# Patient Record
Sex: Male | Born: 1975 | Race: White | Hispanic: No | Marital: Married | Smoking: Former smoker
Health system: Southern US, Community
[De-identification: ages and names within clinical notes are randomized; demographics above are authoritative.]

## PROBLEM LIST (undated history)

## (undated) DIAGNOSIS — L509 Urticaria, unspecified: Secondary | ICD-10-CM

## (undated) HISTORY — DX: Urticaria, unspecified: L50.9

---

## 2016-05-26 ENCOUNTER — Encounter (HOSPITAL_COMMUNITY): Payer: Self-pay | Admitting: Emergency Medicine

## 2016-05-26 ENCOUNTER — Emergency Department (HOSPITAL_COMMUNITY)
Admission: EM | Admit: 2016-05-26 | Discharge: 2016-05-26 | Disposition: A | Discharge status: Home / Self Care | Payer: BLUE CROSS/BLUE SHIELD | Attending: Emergency Medicine | Admitting: Emergency Medicine

## 2016-05-26 DIAGNOSIS — R1084 Generalized abdominal pain: Secondary | ICD-10-CM | POA: Diagnosis present

## 2016-05-26 DIAGNOSIS — Z87891 Personal history of nicotine dependence: Secondary | ICD-10-CM | POA: Diagnosis not present

## 2016-05-26 DIAGNOSIS — R110 Nausea: Secondary | ICD-10-CM | POA: Insufficient documentation

## 2016-05-26 LAB — COMPREHENSIVE METABOLIC PANEL
ALBUMIN: 4.8 g/dL (ref 3.5–5.0)
ALK PHOS: 40 U/L (ref 38–126)
ALT: 15 U/L — ABNORMAL LOW (ref 17–63)
ANION GAP: 9 (ref 5–15)
AST: 18 U/L (ref 15–41)
BILIRUBIN TOTAL: 0.5 mg/dL (ref 0.3–1.2)
BUN: 12 mg/dL (ref 6–20)
CALCIUM: 8.8 mg/dL — AB (ref 8.9–10.3)
CO2: 25 mmol/L (ref 22–32)
Chloride: 102 mmol/L (ref 101–111)
Creatinine, Ser: 1.04 mg/dL (ref 0.61–1.24)
GFR calc non Af Amer: >60 mL/min (ref >60)
GLUCOSE: 111 mg/dL — AB (ref 65–99)
POTASSIUM: 3.9 mmol/L (ref 3.5–5.1)
SODIUM: 136 mmol/L (ref 135–145)
TOTAL PROTEIN: 7.3 g/dL (ref 6.5–8.1)

## 2016-05-26 LAB — CBC
HEMATOCRIT: 41.9 % (ref 39.0–52.0)
HEMOGLOBIN: 14 g/dL (ref 13.0–17.0)
MCH: 30.8 pg (ref 26.0–34.0)
MCHC: 33.4 g/dL (ref 30.0–36.0)
MCV: 92.1 fL (ref 78.0–100.0)
Platelets: 225 10*3/uL (ref 150–400)
RBC: 4.55 MIL/uL (ref 4.22–5.81)
RDW: 12.1 % (ref 11.5–15.5)
WBC: 6 10*3/uL (ref 4.0–10.5)

## 2016-05-26 LAB — URINALYSIS, ROUTINE W REFLEX MICROSCOPIC
Bilirubin Urine: NEGATIVE
Glucose, UA: NEGATIVE mg/dL
Hgb urine dipstick: NEGATIVE
Ketones, ur: NEGATIVE mg/dL
Leukocytes, UA: NEGATIVE
NITRITE: NEGATIVE
PH: 7 (ref 5.0–8.0)
Protein, ur: NEGATIVE mg/dL
SPECIFIC GRAVITY, URINE: 1.017 (ref 1.005–1.030)

## 2016-05-26 LAB — LIPASE, BLOOD: Lipase: 37 U/L (ref 11–51)

## 2016-05-26 NOTE — ED Triage Notes (Addendum)
Pt c/o severe generalized intermittent abdominal pain onset a few hours ago. No n/v/diarrhea. No sick contacts. No rebound tenderness.

## 2016-05-26 NOTE — ED Provider Notes (Signed)
[facility]-EMERGENCY DEPT Provider Note   CSN: [id number] Arrival date & time: 05/26/16  1431     History   Chief Complaint Chief Complaint  Patient presents with  . Abdominal Pain    HPI Nicholas Mosley is a 40 y.o. otherwise healthy male with no PMHx or PSHx who presents to the ED with complaints of sudden onset generalized abdominal pain that began just after lunch time around 1 PM (4.5hrs prior to exam). Patient states that he ate barbecue and a salad at [employer] in [place], went back to work and felt abdominal pain start so he left work to go home, but the pain continued to worsen so he came to the emergency room. He describes the pain as 10/10 intermittent "in waves" generalized sharp nonradiating abd pain, worse with movement, and with no specific treatments tried prior to arrival. Patient states that during the waves of pain he would have some nausea associated with it. He states that approximately 1 hour ago while waiting in the waiting room, the pain and nausea completely resolved and he has no residual symptoms. In fact, he states he was going to leave because the symptoms were gone, but "figured he'd waited 3.5hrs already so he might as well stay and get checked out".  He denies fevers, chills, CP, SOB, ongoing abd pain, ongoing nausea, vomiting, diarrhea, constipation, obstipation, melena, hematochezia, abd distension/bloating, hematuria, dysuria, flank pain, testicular pain/swelling, penile discharge, myalgias, arthralgias, numbness, tingling, weakness, recent travel, sick contacts, suspicious food intake, EtOH use recently, chronic NSAID use, prior abd surgeries, or any other complaints/risk factors.    The history is provided by the patient and medical records. No language interpreter was used.  Abdominal Pain   This is a new problem. The current episode started 3 to 5 hours ago. Episode frequency: intermittent in waves. The problem has been resolved. The pain is associated  with an unknown factor. The pain is located in the generalized abdominal region. The quality of the pain is sharp. The pain is at a severity of 10/10. The pain is severe. Associated symptoms include nausea. Pertinent negatives include fever, diarrhea, flatus, hematochezia, melena, vomiting, constipation, dysuria, hematuria, arthralgias and myalgias. The symptoms are aggravated by activity. Nothing relieves the symptoms.    History reviewed. No pertinent past medical history.  There are no active problems to display for this patient.   History reviewed. No pertinent surgical history.     Home Medications    Prior to Admission medications   Not on File    Family History History reviewed. No pertinent family history.  Social History Social History  Substance Use Topics  . Smoking status: Former Games developer  . Smokeless tobacco: Not on file  . Alcohol use Not on file     Allergies   Patient has no known allergies.   Review of Systems Review of Systems  Constitutional: Negative for chills and fever.  Respiratory: Negative for shortness of breath.   Cardiovascular: Negative for chest pain.  Gastrointestinal: Positive for abdominal pain and nausea. Negative for abdominal distention, blood in stool, constipation, diarrhea, flatus, hematochezia, melena and vomiting.  Genitourinary: Negative for discharge, dysuria, flank pain, hematuria, scrotal swelling and testicular pain.  Musculoskeletal: Negative for arthralgias and myalgias.  Skin: Negative for color change.  Allergic/Immunologic: Negative for immunocompromised state.  Neurological: Negative for weakness and numbness.  Psychiatric/Behavioral: Negative for confusion.   10 Systems reviewed and are negative for acute change except as noted in the HPI.   Physical  Exam Updated Vital Signs BP 118/68 (BP Location: Left Arm)   Pulse 89   Temp 98.1 F (36.7 C) (Oral)   Resp 18   SpO2 100%   Physical Exam  Constitutional: He  is oriented to person, place, and time. Vital signs are normal. He appears well-developed and well-nourished.  Non-toxic appearance. No distress.  Afebrile, nontoxic, NAD  HENT:  Head: Normocephalic and atraumatic.  Mouth/Throat: Oropharynx is clear and moist and mucous membranes are normal.  Eyes: Conjunctivae and EOM are normal. Right eye exhibits no discharge. Left eye exhibits no discharge.  Neck: Normal range of motion. Neck supple.  Cardiovascular: Normal rate, regular rhythm, normal heart sounds and intact distal pulses.  Exam reveals no gallop and no friction rub.   No murmur heard. Pulmonary/Chest: Effort normal and breath sounds normal. No respiratory distress. He has no decreased breath sounds. He has no wheezes. He has no rhonchi. He has no rales.  Abdominal: Soft. Normal appearance and bowel sounds are normal. He exhibits no distension. There is no tenderness. There is no rigidity, no rebound, no guarding, no CVA tenderness, no tenderness at McBurney's point and negative Murphy's sign.  Soft, nondistended, +BS throughout, with no focal areas of tenderness to palpation, reports some discomfort with deep palpation in lower abdomen but denies that it's painful and states it's not at all close to the pain he felt earlier, no r/g/r, neg murphy's, neg mcburney's, no CVA TTP   Musculoskeletal: Normal range of motion.  Neurological: He is alert and oriented to person, place, and time. He has normal strength. No sensory deficit.  Skin: Skin is warm, dry and intact. No rash noted.  Psychiatric: He has a normal mood and affect.  Nursing note and vitals reviewed.    ED Treatments / Results  Labs (all labs ordered are listed, but only abnormal results are displayed) Labs Reviewed  COMPREHENSIVE METABOLIC PANEL - Abnormal; Notable for the following:       Result Value   Glucose, Bld 111 (*)    Calcium 8.8 (*)    ALT 15 (*)    All other components within normal limits  LIPASE, BLOOD  CBC   URINALYSIS, ROUTINE W REFLEX MICROSCOPIC    EKG  EKG Interpretation None       Radiology No results found.  Procedures Procedures (including critical care time)  Medications Ordered in ED Medications - No data to display   Initial Impression / Assessment and Plan / ED Course  I have reviewed the triage vital signs and the nursing notes.  Pertinent labs & imaging results that were available during my care of the patient were reviewed by me and considered in my medical decision making (see chart for details).  Clinical Course     40 y.o. male here with sudden onset generalized abd pain that started right after lunch, made him slightly nauseated from the pain, and was intermittent for ~3.5hrs before spontaneously resolving ~1hr ago while in the waiting room. No ongoing symptoms what-so-ever. On exam, no focal abd TTP, nonperitoneal, no flank pain, no murphy's/mcburney's signs. Labs completely unremarkable. Discussed long list of differentials for nonspecific abd pain, but given reassuring labs/vitals/exam, doubt need for further emergent work up. Discussed tylenol/motrin for pain, strict return precautions given, and f/up with PCP in 1wk. I explained the diagnosis and have given explicit precautions to return to the ER including for any other new or worsening symptoms. The patient understands and accepts the medical plan as it's been  dictated and I have answered their questions. Discharge instructions concerning home care and prescriptions have been given. The patient is STABLE and is discharged to home in good condition.   Final Clinical Impressions(s) / ED Diagnoses   Final diagnoses:  Generalized abdominal pain  Nausea    New Prescriptions New Prescriptions   No medications on file     Suleiman Finigan CampruAllen Derrybi-Soms, PA-C 05/26/16 1805    Jacalyn LefevreJulie Haviland, MD 05/26/16 2318

## 2016-05-26 NOTE — Discharge Instructions (Signed)
Abdominal (belly) pain can be caused by many things. Your caregiver performed an examination and possibly ordered blood/urine tests and imaging (CT scan, x-rays, ultrasound). Many cases can be observed and treated at home after initial evaluation in the emergency department. Try using tylenol or motrin as needed for pain. Eat a bland diet tonight and then advance to your regular diet tomorrow assuming pain has not returned.   Even though you are being discharged home, abdominal pain can be unpredictable. Therefore, you need a repeated exam if your pain does not resolve, returns, or worsens. Most patients with abdominal pain don't have to be admitted to the hospital or have surgery, but serious problems like appendicitis and gallbladder attacks can start out as nonspecific pain. Many abdominal conditions cannot be diagnosed in one visit, so follow-up evaluations are very important.  Follow up with your primary care doctor in 1 week for recheck of symptoms. Return to the ER for changes or worsening symptoms.  SEEK IMMEDIATE MEDICAL ATTENTION IF YOU DEVELOP ANY OF THE FOLLOWING SYMPTOMS: The pain does not go away or becomes severe.  A temperature above 101 develops.  Repeated vomiting occurs (multiple episodes).  The pain becomes localized to portions of the abdomen. The right side could possibly be appendicitis. In an adult, the left lower portion of the abdomen could be colitis or diverticulitis.  Blood is being passed in stools or vomit (bright red or black tarry stools).  Return also if you develop chest pain, difficulty breathing, dizziness or fainting, or become confused, poorly responsive, or inconsolable (young children). The constipation stays for more than 4 days.  There is belly (abdominal) or rectal pain.  You do not seem to be getting better.

## 2016-05-26 NOTE — ED Notes (Signed)
Pt signed E signature pad, but pad did not registered. Secretary made aware

## 2017-05-05 DIAGNOSIS — Z23 Encounter for immunization: Secondary | ICD-10-CM | POA: Diagnosis not present

## 2017-05-05 DIAGNOSIS — H6120 Impacted cerumen, unspecified ear: Secondary | ICD-10-CM | POA: Diagnosis not present

## 2017-09-05 DIAGNOSIS — M25562 Pain in left knee: Secondary | ICD-10-CM | POA: Diagnosis not present

## 2017-09-07 DIAGNOSIS — M25562 Pain in left knee: Secondary | ICD-10-CM | POA: Diagnosis not present

## 2017-09-08 DIAGNOSIS — M25562 Pain in left knee: Secondary | ICD-10-CM | POA: Diagnosis not present

## 2017-09-14 DIAGNOSIS — M25562 Pain in left knee: Secondary | ICD-10-CM | POA: Diagnosis not present

## 2017-09-25 DIAGNOSIS — M25562 Pain in left knee: Secondary | ICD-10-CM | POA: Diagnosis not present

## 2017-10-17 DIAGNOSIS — M25862 Other specified joint disorders, left knee: Secondary | ICD-10-CM | POA: Diagnosis not present

## 2017-10-17 DIAGNOSIS — M25562 Pain in left knee: Secondary | ICD-10-CM | POA: Diagnosis not present

## 2017-10-17 DIAGNOSIS — M949 Disorder of cartilage, unspecified: Secondary | ICD-10-CM | POA: Diagnosis not present

## 2017-10-17 DIAGNOSIS — M2141 Flat foot [pes planus] (acquired), right foot: Secondary | ICD-10-CM | POA: Diagnosis not present

## 2017-10-27 DIAGNOSIS — M25569 Pain in unspecified knee: Secondary | ICD-10-CM | POA: Diagnosis not present

## 2017-11-01 DIAGNOSIS — M25562 Pain in left knee: Secondary | ICD-10-CM | POA: Diagnosis not present

## 2017-11-06 DIAGNOSIS — M25562 Pain in left knee: Secondary | ICD-10-CM | POA: Diagnosis not present

## 2017-11-08 DIAGNOSIS — M25562 Pain in left knee: Secondary | ICD-10-CM | POA: Diagnosis not present

## 2017-11-15 DIAGNOSIS — M25562 Pain in left knee: Secondary | ICD-10-CM | POA: Diagnosis not present

## 2017-11-17 DIAGNOSIS — M25562 Pain in left knee: Secondary | ICD-10-CM | POA: Diagnosis not present

## 2017-11-20 DIAGNOSIS — M25562 Pain in left knee: Secondary | ICD-10-CM | POA: Diagnosis not present

## 2017-11-22 DIAGNOSIS — M25562 Pain in left knee: Secondary | ICD-10-CM | POA: Diagnosis not present

## 2017-11-27 DIAGNOSIS — M25562 Pain in left knee: Secondary | ICD-10-CM | POA: Diagnosis not present

## 2017-11-29 DIAGNOSIS — M25562 Pain in left knee: Secondary | ICD-10-CM | POA: Diagnosis not present

## 2018-01-05 DIAGNOSIS — E7800 Pure hypercholesterolemia, unspecified: Secondary | ICD-10-CM | POA: Diagnosis not present

## 2018-01-05 DIAGNOSIS — Z79899 Other long term (current) drug therapy: Secondary | ICD-10-CM | POA: Diagnosis not present

## 2018-06-07 DIAGNOSIS — L309 Dermatitis, unspecified: Secondary | ICD-10-CM | POA: Diagnosis not present

## 2018-06-07 DIAGNOSIS — H6122 Impacted cerumen, left ear: Secondary | ICD-10-CM | POA: Diagnosis not present

## 2019-01-07 DIAGNOSIS — Z79899 Other long term (current) drug therapy: Secondary | ICD-10-CM | POA: Diagnosis not present

## 2019-01-07 DIAGNOSIS — E7800 Pure hypercholesterolemia, unspecified: Secondary | ICD-10-CM | POA: Diagnosis not present

## 2019-02-18 DIAGNOSIS — Z79899 Other long term (current) drug therapy: Secondary | ICD-10-CM | POA: Diagnosis not present

## 2019-02-18 DIAGNOSIS — E7800 Pure hypercholesterolemia, unspecified: Secondary | ICD-10-CM | POA: Diagnosis not present

## 2019-07-31 ENCOUNTER — Other Ambulatory Visit: Payer: BLUE CROSS/BLUE SHIELD

## 2019-07-31 DIAGNOSIS — Z03818 Encounter for observation for suspected exposure to other biological agents ruled out: Secondary | ICD-10-CM | POA: Diagnosis not present

## 2019-07-31 DIAGNOSIS — Z20828 Contact with and (suspected) exposure to other viral communicable diseases: Secondary | ICD-10-CM | POA: Diagnosis not present

## 2019-10-01 DIAGNOSIS — Z86018 Personal history of other benign neoplasm: Secondary | ICD-10-CM | POA: Diagnosis not present

## 2019-10-01 DIAGNOSIS — D225 Melanocytic nevi of trunk: Secondary | ICD-10-CM | POA: Diagnosis not present

## 2019-10-01 DIAGNOSIS — L219 Seborrheic dermatitis, unspecified: Secondary | ICD-10-CM | POA: Diagnosis not present

## 2019-10-01 DIAGNOSIS — L578 Other skin changes due to chronic exposure to nonionizing radiation: Secondary | ICD-10-CM | POA: Diagnosis not present

## 2019-10-29 DIAGNOSIS — H9209 Otalgia, unspecified ear: Secondary | ICD-10-CM | POA: Diagnosis not present

## 2019-11-26 ENCOUNTER — Ambulatory Visit (INDEPENDENT_AMBULATORY_CARE_PROVIDER_SITE_OTHER): Payer: BC Managed Care – PPO | Admitting: Plastic Surgery

## 2019-11-26 ENCOUNTER — Other Ambulatory Visit: Payer: Self-pay

## 2019-11-26 ENCOUNTER — Encounter: Payer: Self-pay | Admitting: Plastic Surgery

## 2019-11-26 DIAGNOSIS — L723 Sebaceous cyst: Secondary | ICD-10-CM

## 2019-11-26 NOTE — Progress Notes (Signed)
   Subjective:    Patient ID: Nicholas Mosley, male    DOB: [birth year], 44 y.o.   MRN: [id number]  The patient is a 44 year old white male here for evaluation of a cyst.  The patient states he has had it twice in the past.  The last time was about 10 years ago. It seems to come and go intermittently.  It gets a little bit tender and red.  Right now it is slightly palpable but not very clear if it cyst or scar tissue.  Is in very good health he is not had any recent illnesses.  He is on medication for controlling his hyperlipidemia.    Review of Systems  Constitutional: Negative.  Negative for activity change and appetite change.  HENT: Negative.   Eyes: Negative.   Respiratory: Negative.  Negative for chest tightness.   Cardiovascular: Negative.   Gastrointestinal: Negative.   Endocrine: Negative.   Genitourinary: Negative.   Musculoskeletal: Negative.   Skin: Positive for color change.  Neurological: Negative.   Hematological: Negative.   Psychiatric/Behavioral: Negative.        Objective:   Physical Exam Vitals and nursing note reviewed.  Constitutional:      Appearance: Normal appearance.  HENT:     Head: Normocephalic and atraumatic.  Cardiovascular:     Rate and Rhythm: Normal rate.     Pulses: Normal pulses.  Pulmonary:     Effort: Pulmonary effort is normal. No respiratory distress.  Abdominal:     General: Abdomen is flat. There is no distension.  Skin:    General: Skin is warm.     Capillary Refill: Capillary refill takes less than 2 seconds.  Neurological:     General: No focal deficit present.     Mental Status: He is alert and oriented to person, place, and time.  Psychiatric:        Mood and Affect: Mood normal.        Behavior: Behavior normal.        Thought Content: Thought content normal.        Assessment & Plan:     ICD-10-CM   1. Sebaceous cyst  L72.3    Recommend sebaceous cyst when it is more prominent.  We can be flexible to the patient's  timeframe based on the cyst maturity. Pictures were obtained of the patient and placed in the chart with the patient's or guardian's permission.

## 2019-12-16 DIAGNOSIS — M25512 Pain in left shoulder: Secondary | ICD-10-CM | POA: Diagnosis not present

## 2019-12-16 DIAGNOSIS — M7552 Bursitis of left shoulder: Secondary | ICD-10-CM | POA: Diagnosis not present

## 2020-01-28 ENCOUNTER — Ambulatory Visit: Payer: BC Managed Care – PPO | Admitting: Plastic Surgery

## 2020-03-27 ENCOUNTER — Ambulatory Visit: Payer: BC Managed Care – PPO | Admitting: Plastic Surgery

## 2020-06-05 ENCOUNTER — Telehealth (HOSPITAL_COMMUNITY): Payer: Self-pay

## 2020-06-05 ENCOUNTER — Encounter: Payer: Self-pay | Admitting: Oncology

## 2020-06-05 ENCOUNTER — Other Ambulatory Visit: Payer: Self-pay | Admitting: Oncology

## 2020-06-05 ENCOUNTER — Telehealth: Payer: Self-pay | Admitting: Oncology

## 2020-06-05 ENCOUNTER — Ambulatory Visit (HOSPITAL_COMMUNITY)
Admission: RE | Admit: 2020-06-05 | Discharge: 2020-06-05 | Disposition: A | Discharge status: Home / Self Care | Payer: BC Managed Care – PPO | Source: Ambulatory Visit | Attending: Pulmonary Disease | Admitting: Pulmonary Disease

## 2020-06-05 DIAGNOSIS — U071 COVID-19: Secondary | ICD-10-CM

## 2020-06-05 MED ORDER — FAMOTIDINE IN NACL 20-0.9 MG/50ML-% IV SOLN: 20.0000 mg | Freq: Once | INTRAVENOUS | Status: DC | PRN

## 2020-06-05 MED ORDER — SODIUM CHLORIDE 0.9 % IV SOLN: Freq: Once | INTRAVENOUS | Status: AC

## 2020-06-05 MED ORDER — EPINEPHRINE 0.3 MG/0.3ML IJ SOAJ: 0.3000 mg | Freq: Once | INTRAMUSCULAR | Status: DC | PRN

## 2020-06-05 MED ORDER — METHYLPREDNISOLONE SODIUM SUCC 125 MG IJ SOLR: 125.0000 mg | Freq: Once | INTRAMUSCULAR | Status: DC | PRN

## 2020-06-05 MED ORDER — DIPHENHYDRAMINE HCL 50 MG/ML IJ SOLN: 50.0000 mg | Freq: Once | INTRAMUSCULAR | Status: DC | PRN

## 2020-06-05 MED ORDER — SODIUM CHLORIDE 0.9 % IV SOLN: INTRAVENOUS | Status: DC | PRN

## 2020-06-05 MED ORDER — ALBUTEROL SULFATE HFA 108 (90 BASE) MCG/ACT IN AERS: 2.0000 | INHALATION_SPRAY | Freq: Once | RESPIRATORY_TRACT | Status: DC | PRN

## 2020-06-05 NOTE — Telephone Encounter (Signed)
Re: [facility]  Called to Discuss with patient about Covid symptoms and the use of regeneron, a monoclonal antibody infusion for those with mild to moderate Covid symptoms and at a high risk of hospitalization.     Pt is qualified for this infusion at the [facility] due to co-morbid conditions and/or a member of an at-risk group.    No past medical history on file.  Specific risk condition- HLD and obesity (BMI 28.1)   Unable to reach pt. Left VM and MCM.  Mignon Pine, AGNP-C [phone] (Infusion Center Hotline)

## 2020-06-05 NOTE — Telephone Encounter (Signed)
Contacted patient to see if he would like his appointment today at 1630. He agrees and will be here for his antibody infusion at 1630.

## 2020-06-05 NOTE — Progress Notes (Signed)
Patient reviewed Fact Sheet for Patients, Parents, and Caregivers for Emergency Use Authorization (EUA) of bamlanivimab and etesevimab for the Treatment of Coronavirus. Patient also reviewed and is agreeable to the estimated cost of treatment. Patient is agreeable to proceed.   

## 2020-06-05 NOTE — Progress Notes (Signed)
  Diagnosis: COVID-19  Physician: Dr. Wright   Procedure: [facility] Med: bamlanivimab\etesevimab infusion - Provided patient with bamlanimivab\etesevimab fact sheet for patients, parents and caregivers prior to infusion.  Complications: No immediate complications noted.  Discharge: Discharged home   Rebbie Lauricella  B Nathanie Ottley 06/05/2020   

## 2020-06-05 NOTE — Progress Notes (Signed)
I connected by phone with  Nicholas Mosley to discuss the potential use of an new treatment for mild to moderate COVID-19 viral infection in non-hospitalized patients.   This patient is a age/sex that meets the FDA criteria for Emergency Use Authorization of casirivimab\imdevimab.  Has a (+) direct SARS-CoV-2 viral test result 1. Has mild or moderate COVID-19  2. Is ? 43 years of age and weighs ? 40 kg 3. Is NOT hospitalized due to COVID-19 4. Is NOT requiring oxygen therapy or requiring an increase in baseline oxygen flow rate due to COVID-19 5. Is within 10 days of symptom onset 6. Has at least one of the high risk factor(s) for progression to severe COVID-19 and/or hospitalization as defined in EUA. ? Specific high risk criteria :HLD and obesity (BMI 28.1)   Symptom onset  05/31/20  I have spoken and communicated the following to the patient or parent/caregiver:   1. FDA has authorized the emergency use of casirivimab\imdevimab for the treatment of mild to moderate COVID-19 in adults and pediatric patients with positive results of direct SARS-CoV-2 viral testing who are 25 years of age and older weighing at least 40 kg, and who are at high risk for progressing to severe COVID-19 and/or hospitalization.   2. The significant known and potential risks and benefits of casirivimab\imdevimab, and the extent to which such potential risks and benefits are unknown.   3. Information on available alternative treatments and the risks and benefits of those alternatives, including clinical trials.   4. Patients treated with casirivimab\imdevimab should continue to self-isolate and use infection control measures (e.g., wear mask, isolate, social distance, avoid sharing personal items, clean and disinfect "high touch" surfaces, and frequent handwashing) according to CDC guidelines.    5. The patient or parent/caregiver has the option to accept or refuse casirivimab\imdevimab .   After reviewing this  information with the patient, The patient agreed to proceed with receiving casirivimab\imdevimab infusion and will be provided a copy of the Fact sheet prior to receiving the infusion.Mignon Pine, AGNP-C [phone] (Infusion Center Hotline)

## 2020-06-05 NOTE — Discharge Instructions (Signed)
10 Things You Can Do to Manage Your COVID-19 Symptoms at Home If you have possible or confirmed COVID-19: 1. Stay home from work and school. And stay away from other public places. If you must go out, avoid using any kind of public transportation, ridesharing, or taxis. 2. Monitor your symptoms carefully. If your symptoms get worse, call your healthcare provider immediately. 3. Get rest and stay hydrated. 4. If you have a medical appointment, call the healthcare provider ahead of time and tell them that you have or may have COVID-19. 5. For medical emergencies, call 911 and notify the dispatch personnel that you have or may have COVID-19. 6. Cover your cough and sneezes with a tissue or use the inside of your elbow. 7. Wash your hands often with soap and water for at least 20 seconds or clean your hands with an alcohol-based hand sanitizer that contains at least 60% alcohol. 8. As much as possible, stay in a specific room and away from other people in your home. Also, you should use a separate bathroom, if available. If you need to be around other people in or outside of the home, wear a mask. 9. Avoid sharing personal items with other people in your household, like dishes, towels, and bedding. 10. Clean all surfaces that are touched often, like counters, tabletops, and doorknobs. Use household cleaning sprays or wipes according to the label instructions. cdc.gov/coronavirus 12/19/2018 This information is not intended to replace advice given to you by your health care provider. Make sure you discuss any questions you have with your health care provider. Document Revised: 05/23/2019 Document Reviewed: 05/23/2019 Elsevier Patient Education  2020 Elsevier Inc. What types of side effects do monoclonal antibody drugs cause?  Common side effects  In general, the more common side effects caused by monoclonal antibody drugs include: . Allergic reactions, such as hives or itching . Flu-like signs and  symptoms, including chills, fatigue, fever, and muscle aches and pains . Nausea, vomiting . Diarrhea . Skin rashes . Low blood pressure   The CDC is recommending patients who receive monoclonal antibody treatments wait at least 90 days before being vaccinated.  Currently, there are no data on the safety and efficacy of mRNA COVID-19 vaccines in persons who received monoclonal antibodies or convalescent plasma as part of COVID-19 treatment. Based on the estimated half-life of such therapies as well as evidence suggesting that reinfection is uncommon in the 90 days after initial infection, vaccination should be deferred for at least 90 days, as a precautionary measure until additional information becomes available, to avoid interference of the antibody treatment with vaccine-induced immune responses. If you have any questions or concerns after the infusion please call the Advanced Practice Provider on call at [phone]. This number is ONLY intended for your use regarding questions or concerns about the infusion post-treatment side-effects.  Please do not provide this number to others for use. For return to work notes please contact your primary care provider.   If someone you know is interested in receiving treatment please have them call the COVID hotline at [phone].   

## 2020-06-06 ENCOUNTER — Ambulatory Visit (HOSPITAL_COMMUNITY): Payer: BC Managed Care – PPO

## 2021-06-07 ENCOUNTER — Other Ambulatory Visit: Payer: Self-pay | Admitting: Family Medicine

## 2021-06-07 DIAGNOSIS — Z8249 Family history of ischemic heart disease and other diseases of the circulatory system: Secondary | ICD-10-CM

## 2021-06-25 DIAGNOSIS — E291 Testicular hypofunction: Secondary | ICD-10-CM | POA: Diagnosis not present

## 2021-07-07 ENCOUNTER — Other Ambulatory Visit: Payer: BC Managed Care – PPO

## 2021-07-07 DIAGNOSIS — L578 Other skin changes due to chronic exposure to nonionizing radiation: Secondary | ICD-10-CM | POA: Diagnosis not present

## 2021-07-07 DIAGNOSIS — L905 Scar conditions and fibrosis of skin: Secondary | ICD-10-CM | POA: Diagnosis not present

## 2021-07-07 DIAGNOSIS — D485 Neoplasm of uncertain behavior of skin: Secondary | ICD-10-CM | POA: Diagnosis not present

## 2021-07-07 DIAGNOSIS — Z86018 Personal history of other benign neoplasm: Secondary | ICD-10-CM | POA: Diagnosis not present

## 2021-07-07 DIAGNOSIS — D225 Melanocytic nevi of trunk: Secondary | ICD-10-CM | POA: Diagnosis not present

## 2021-07-23 DIAGNOSIS — M7552 Bursitis of left shoulder: Secondary | ICD-10-CM | POA: Diagnosis not present

## 2021-07-23 DIAGNOSIS — M25512 Pain in left shoulder: Secondary | ICD-10-CM | POA: Diagnosis not present

## 2021-08-02 ENCOUNTER — Other Ambulatory Visit: Payer: Self-pay

## 2021-08-02 ENCOUNTER — Ambulatory Visit
Admission: RE | Admit: 2021-08-02 | Discharge: 2021-08-02 | Disposition: A | Discharge status: Home / Self Care | Payer: No Typology Code available for payment source | Source: Ambulatory Visit | Attending: Family Medicine | Admitting: Family Medicine

## 2021-08-02 DIAGNOSIS — Z8249 Family history of ischemic heart disease and other diseases of the circulatory system: Secondary | ICD-10-CM

## 2021-08-18 DIAGNOSIS — J3489 Other specified disorders of nose and nasal sinuses: Secondary | ICD-10-CM | POA: Diagnosis not present

## 2022-05-04 DIAGNOSIS — D125 Benign neoplasm of sigmoid colon: Secondary | ICD-10-CM | POA: Diagnosis not present

## 2022-05-04 DIAGNOSIS — K648 Other hemorrhoids: Secondary | ICD-10-CM | POA: Diagnosis not present

## 2022-05-04 DIAGNOSIS — Z1211 Encounter for screening for malignant neoplasm of colon: Secondary | ICD-10-CM | POA: Diagnosis not present

## 2022-05-11 DIAGNOSIS — Z1211 Encounter for screening for malignant neoplasm of colon: Secondary | ICD-10-CM | POA: Diagnosis not present

## 2022-05-11 DIAGNOSIS — D125 Benign neoplasm of sigmoid colon: Secondary | ICD-10-CM | POA: Diagnosis not present

## 2022-07-06 DIAGNOSIS — L509 Urticaria, unspecified: Secondary | ICD-10-CM | POA: Diagnosis not present

## 2022-07-06 DIAGNOSIS — T7840XA Allergy, unspecified, initial encounter: Secondary | ICD-10-CM | POA: Diagnosis not present

## 2022-07-20 DIAGNOSIS — Z125 Encounter for screening for malignant neoplasm of prostate: Secondary | ICD-10-CM | POA: Diagnosis not present

## 2022-07-20 DIAGNOSIS — Z79899 Other long term (current) drug therapy: Secondary | ICD-10-CM | POA: Diagnosis not present

## 2022-07-20 DIAGNOSIS — Z0000 Encounter for general adult medical examination without abnormal findings: Secondary | ICD-10-CM | POA: Diagnosis not present

## 2022-07-20 DIAGNOSIS — Z23 Encounter for immunization: Secondary | ICD-10-CM | POA: Diagnosis not present

## 2022-07-20 DIAGNOSIS — E7800 Pure hypercholesterolemia, unspecified: Secondary | ICD-10-CM | POA: Diagnosis not present

## 2023-01-29 IMAGING — CT CT CARDIAC CORONARY ARTERY CALCIUM SCORE
3 series · 13 of 20 positions shown, 15 images · non-contrast
Comparison: None.

CLINICAL DATA: 45-year-old white male with family history of
coronary artery disease.

EXAM:
CT CARDIAC CORONARY ARTERY CALCIUM SCORE
TECHNIQUE: Non-contrast imaging through the heart was performed using
prospective ECG gating. Image post processing was performed on an
independent workstation, allowing for quantitative analysis of the
heart and coronary arteries. Note that this exam targets the heart
and the chest was not imaged in its entirety.

[Series 2: calcium scoring 2.00 qr36 bestdiast 69% hrt calciu · axial · 0.39mm/px · z∈[+1702,+1768]mm · 3 of 83 slices shown]
[im 17/83  vessel]
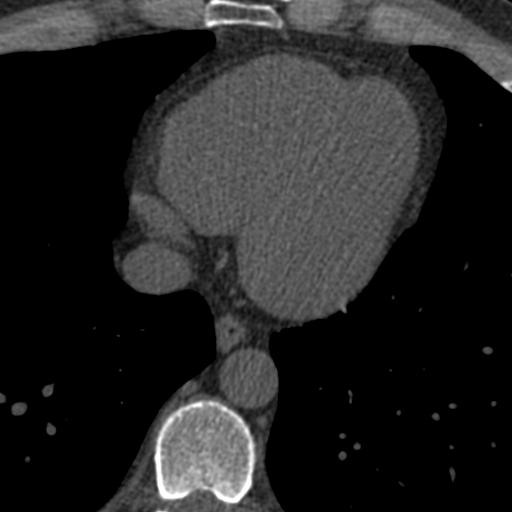
[im 33/83  vessel]
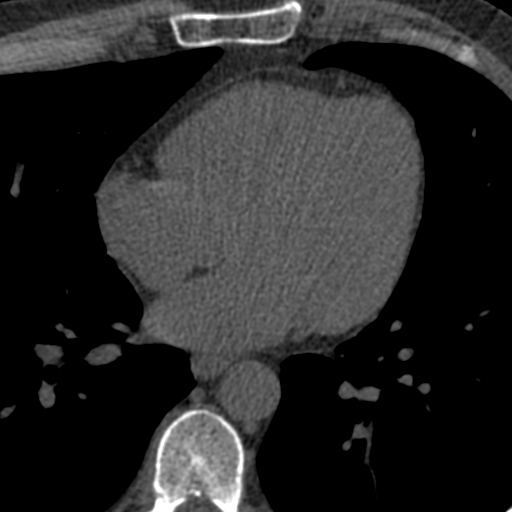
[im 50/83  vessel]
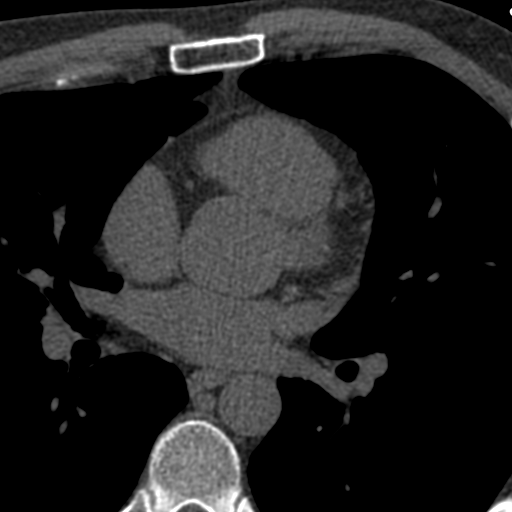

[Series 3: calcium scoring 2.00 br40 bestdiast 69% axial · axial · 0.60mm/px · z∈[+1686,+1804]mm · 5 of 89 slices shown, 7 images]
[im 15/89  vessel]
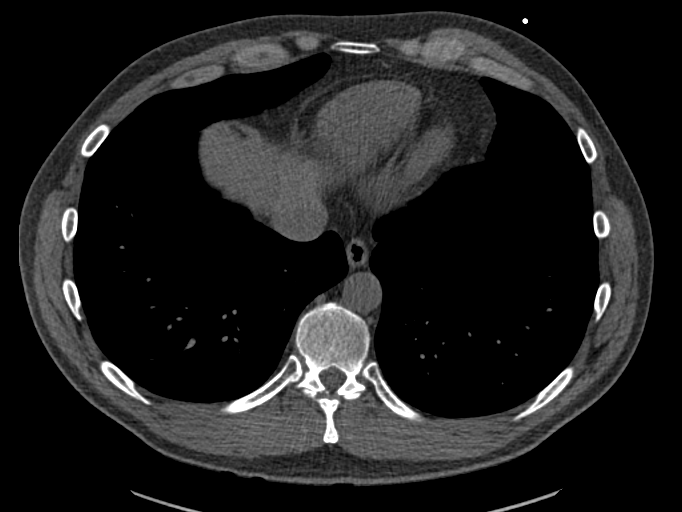
[im 15/89  lung]
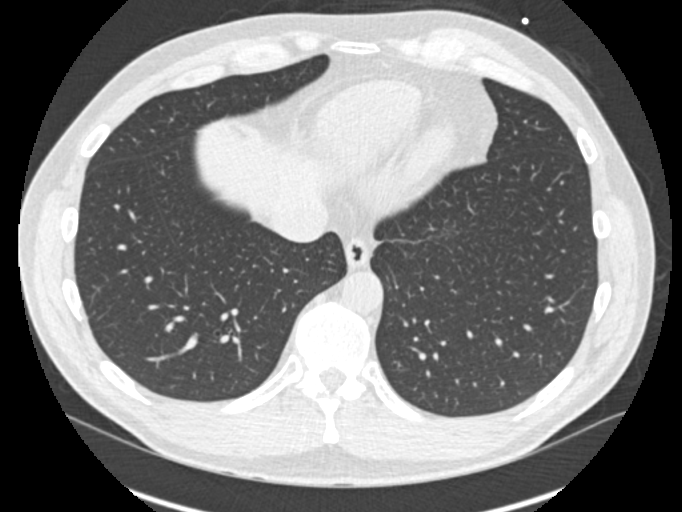
[im 30/89  vessel]
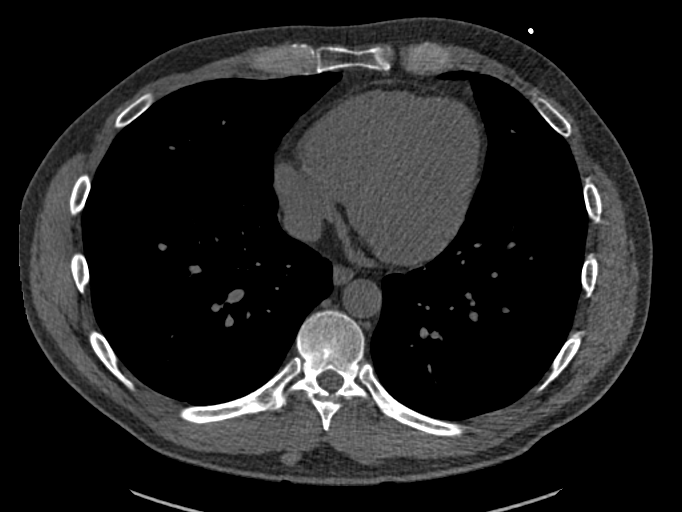
[im 45/89  vessel]
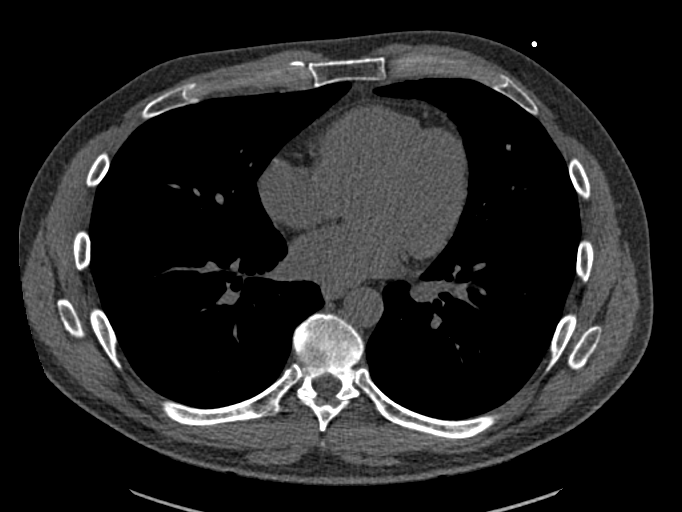
[im 59/89  vessel]
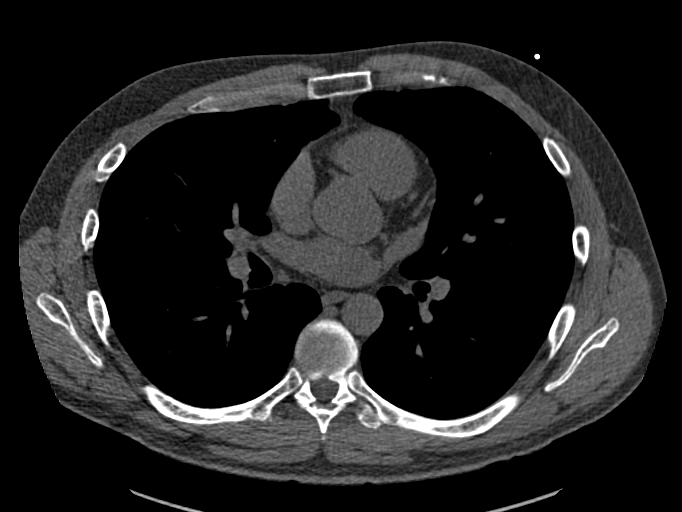
[im 74/89  vessel]
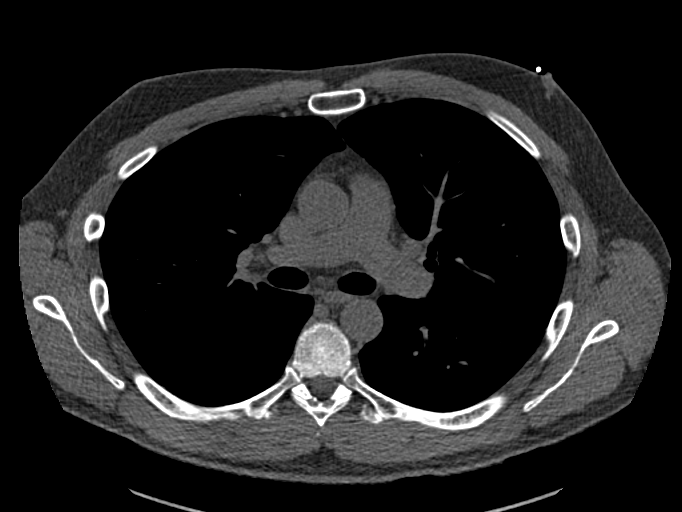
[im 74/89  lung]
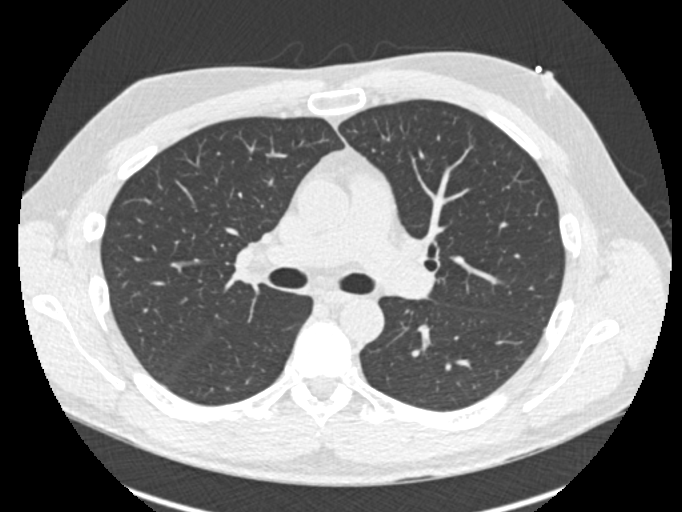

[Series 9: calcium scoring 2.00 br60 bestdiast 69% lungs · axial · 0.61mm/px · z∈[+1686,+1804]mm · 5 of 89 slices shown]
[im 15/89  vessel]
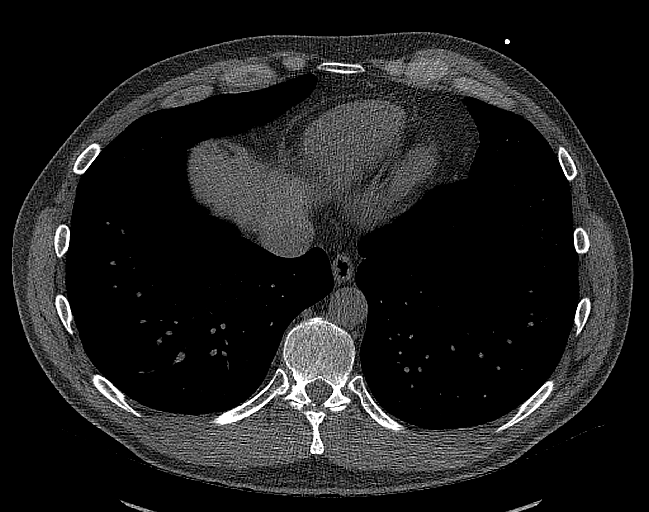
[im 30/89  vessel]
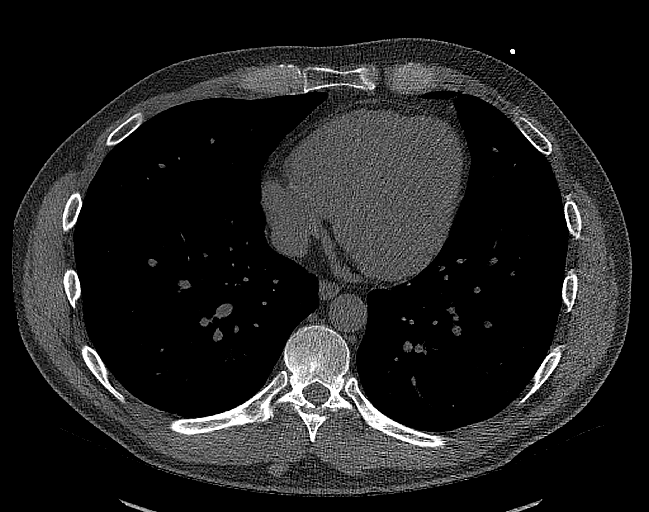
[im 45/89  vessel]
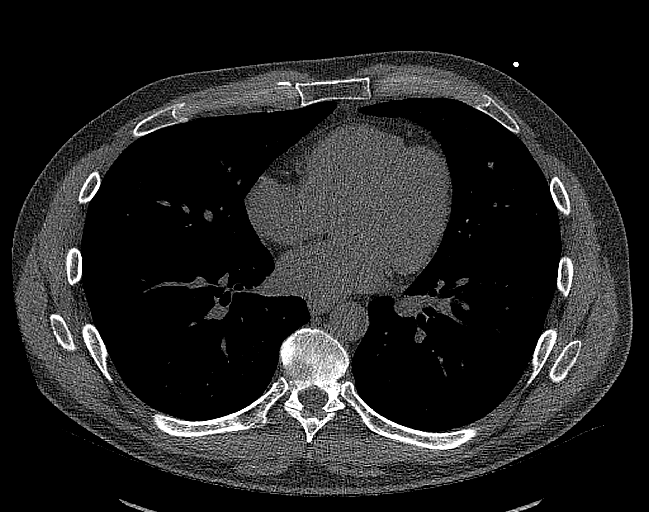
[im 59/89  vessel]
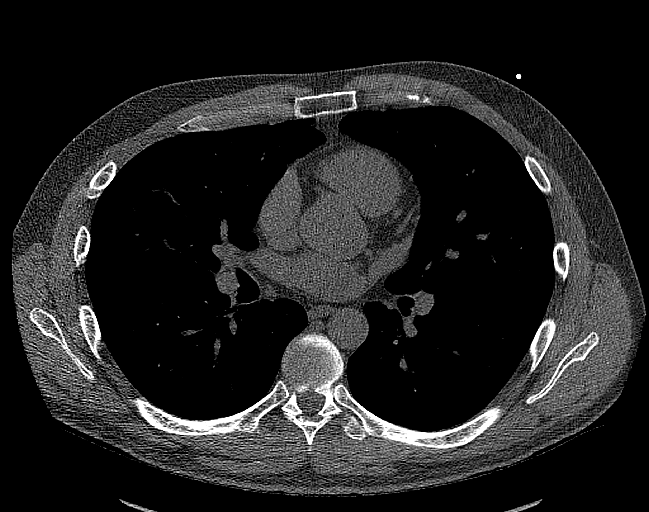
[im 74/89  vessel]
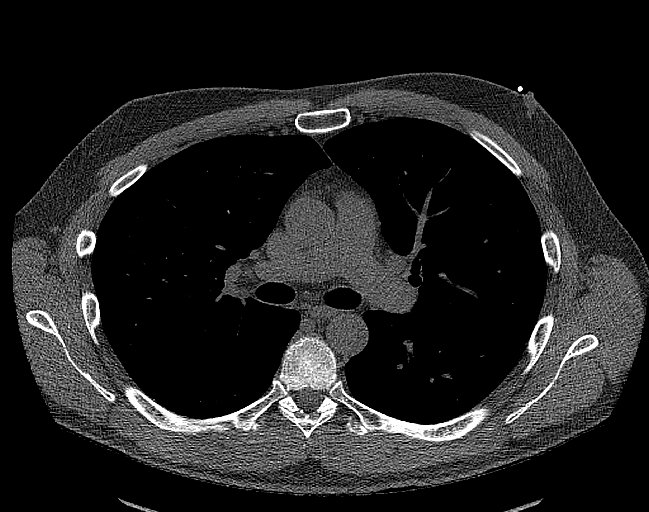

[13 of 20 positions shown; findings below may reference images not displayed]

FINDINGS: CORONARY CALCIUM SCORES:

Left Main: 0

LAD: 32

LCx: 0

RCA: 0

Total Agatston Score: 32

[HOSPITAL] percentile: 89

AORTA MEASUREMENTS:

Ascending Aorta: 29 mm

Descending Aorta: 25 mm

OTHER FINDINGS:

Heart size is normal. No significant pericardial fluid. Visualized
mediastinal structures are normal. Images of the upper abdomen are
unremarkable. 2 mm nodule in the left lower lobe on sequence 9 image
81. 2 small adjacent nodules in the left lower lobe on sequence 9
image 55, largest measures 3 mm. No large pleural effusions. No
large areas of airspace disease or lung consolidation. No acute bone
abnormality.
IMPRESSION: 1. Coronary calcium score is 32 and this is at percentile 89 for a
46-year-old white male.
2. Small pulmonary nodules, largest measuring 3 mm. These small
nodules are indeterminate. No follow-up needed if patient is
low-risk (and has no known or suspected primary neoplasm).
Non-contrast chest CT can be considered in 12 months if patient is
high-risk. This recommendation follows the consensus statement:
Guidelines for Management of Incidental Pulmonary Nodules Detected

## 2023-08-29 ENCOUNTER — Other Ambulatory Visit: Payer: Self-pay

## 2023-08-29 ENCOUNTER — Ambulatory Visit (INDEPENDENT_AMBULATORY_CARE_PROVIDER_SITE_OTHER): Payer: BC Managed Care – PPO | Admitting: Allergy & Immunology

## 2023-08-29 ENCOUNTER — Encounter: Payer: Self-pay | Admitting: Allergy & Immunology

## 2023-08-29 VITALS — BP 116/82 | HR 67 | Temp 98.8°F | Resp 20 | Ht 76.5 in | Wt 246.1 lb

## 2023-08-29 DIAGNOSIS — J3489 Other specified disorders of nose and nasal sinuses: Secondary | ICD-10-CM | POA: Diagnosis not present

## 2023-08-29 NOTE — Progress Notes (Signed)
 NEW PATIENT  Date of Service/Encounter:  08/29/23  Consult requested by: Nicholas Heys, MD (Inactive)   Assessment:   Rhinorrhea - planning for skin testing at the next visit (responsive to azelastine and oral antihistamines)  Plan/Recommendations:   1. Rhinorrhea - Because of insurance stipulations, we cannot do skin testing on the same day as your first visit. - We are all working to fight this, but for now we need to do two separate visits.  - We will know more after we do testing at the next visit.  - The skin testing visit can be squeezed in at your convenience.  - Then we can make a more full plan to address all of your symptoms. - Be sure to stop your antihistamines for 3 days before this appointment.   2. Return in about 1 week (around 09/05/2023) for SKIN TESTING. You can have the follow up appointment with Dr. Dellis Anes or a Nurse Practicioner (our Nurse Practitioners are excellent and always have Physician oversight!).   This note in its entirety was forwarded to the Provider who requested this consultation.  Subjective:   Nicholas Mosley is a 48 y.o. male presenting today for evaluation of  Chief Complaint  Patient presents with   Establish Care    Daily bases when waking up-sneezing, watery eyes, runny noses. Would like to know what specific things may be causing symptoms.     Nicholas Mosley has a history of the following: Patient Active Problem List   Diagnosis Date Noted   Sebaceous cyst 11/26/2019    History obtained from: chart review and patient.  Discussed the use of AI scribe software for clinical note transcription with the patient and/or guardian, who gave verbal consent to proceed.  Nicholas Mosley (pronounced FIFES) was referred by Nicholas Heys, MD (Inactive).     Nicholas Mosley is a 48 y.o. male presenting for an evaluation of rhinorrhea .  Theadore reports that he experiences perennial allergic rhinitis with symptoms of rhinorrhea, epiphora, and  sneezing, primarily in the morning. These symptoms have developed over the past couple of years and are intermittent, with some days being symptom-free without medication. He is seeking to identify the trigger for these symptoms, which occur year-round but are not present every day.  He uses azelastine nasal spray and loratadine (Claritin) from [employer], which effectively control the symptoms. He wants to avoid perpetual medication use if possible. Symptoms are mostly absent when he is away from his home environment, although they occasionally occur at his parents' place.  He has a dog, a hypoallergenic variety, which he does not believe is the cause of his allergies, as he grew up with pets and the dog has been with him for five years, predating the onset of symptoms. He works from home and does not experience symptoms during the day. There is no known mold issue in his house, and he does not notice a significant change in symptoms with different seasons, although there might be a slight increase during high pollen times.  He does notice that it does occur even when he goes to the beach, but it does not seem quite as prevalent.  He has a history of smoking for about fifteen years but has not developed any wheezing or respiratory symptoms. No asthma, hives, itching, sinus infections, ear infections, or pneumonia.  He has two children, aged twelve and fourteen, who do not have similar allergy issues. His siblings do not have any allergy issues.   Otherwise, there is no  history of other atopic diseases, including drug allergies, stinging insect allergies, or contact dermatitis. There is no significant infectious history. Vaccinations are up to date.    Past Medical History: Patient Active Problem List   Diagnosis Date Noted   Sebaceous cyst 11/26/2019    Medication List:  Allergies as of 08/29/2023       Reactions   Magnesium-containing Compounds Hives   Sulfa Antibiotics Hives, Rash         Medication List        Accurate as of August 29, 2023 10:39 AM. If you have any questions, ask your nurse or doctor.          Ciclopirox 1 % shampoo ciclopirox 1 % shampoo   loratadine 10 MG dissolvable tablet Commonly known as: CLARITIN REDITABS Take 10 mg by mouth daily.   multivitamin with minerals Tabs tablet Take 1 tablet by mouth at bedtime.   omega-3 acid ethyl esters 1 g capsule Commonly known as: LOVAZA Take 1 g by mouth at bedtime.   rosuvastatin 5 MG tablet Commonly known as: CRESTOR Take 5 mg by mouth at bedtime.        Birth History: non-contributory  Developmental History: non-contributory  Past Surgical History: History reviewed. No pertinent surgical history.   Family History: History reviewed. No pertinent family history.   Social History: Janice lives at home with his family.  They live in a house that is 47 years old.  There is hardwood throughout the home with carpeting in the bedrooms.  They have a heat pump for heating and central cooling.  There is 1 hypoallergenic dog in the home.  There are dust mite covers on the pillows, but not the bed.  There is no tobacco exposure.  He currently works as an Librarian, academic at [employer].  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  They do have a HEPA filter in the home.  [place] do not [place] near an interstate or industrial area.  There is no fume, chemical, or dust exposure.   Review of systems otherwise negative other than that mentioned in the HPI.    Objective:   Blood pressure 116/82, pulse 67, temperature 98.8 F (37.1 C), temperature source Temporal, resp. rate 20, height 6' 4.5" (1.943 m), weight 246 lb 1.6 oz (111.6 kg), SpO2 98%. Body mass index is 29.57 kg/m.     Physical Exam Vitals reviewed.  Constitutional:      Appearance: He is well-developed.     Comments: Pleasant.  HENT:     Head: Normocephalic and atraumatic.     Right Ear:  Tympanic membrane, ear canal and external ear normal. No drainage, swelling or tenderness. Tympanic membrane is not injected, scarred, erythematous, retracted or bulging.     Left Ear: Tympanic membrane, ear canal and external ear normal. No drainage, swelling or tenderness. Tympanic membrane is not injected, scarred, erythematous, retracted or bulging.     Nose: Rhinorrhea present. No nasal deformity, septal deviation or mucosal edema.     Right Turbinates: Enlarged and swollen.     Left Turbinates: Enlarged and swollen.     Right Sinus: No maxillary sinus tenderness or frontal sinus tenderness.     Left Sinus: No maxillary sinus tenderness or frontal sinus tenderness.     Mouth/Throat:     Mouth: Mucous membranes are not pale and not dry.     Pharynx: Uvula midline.  Eyes:     General:  Right eye: No discharge.        Left eye: No discharge.     Conjunctiva/sclera: Conjunctivae normal.     Right eye: Right conjunctiva is not injected. No chemosis.    Left eye: Left conjunctiva is not injected. No chemosis.    Pupils: Pupils are equal, round, and reactive to light.  Cardiovascular:     Rate and Rhythm: Normal rate and regular rhythm.     Heart sounds: Normal heart sounds.  Pulmonary:     Effort: Pulmonary effort is normal. No tachypnea, accessory muscle usage or respiratory distress.     Breath sounds: Normal breath sounds. No wheezing, rhonchi or rales.  Chest:     Chest wall: No tenderness.  Lymphadenopathy:     Head:     Right side of head: No submandibular, tonsillar or occipital adenopathy.     Left side of head: No submandibular, tonsillar or occipital adenopathy.     Cervical: No cervical adenopathy.  Skin:    Coloration: Skin is not pale.     Findings: No abrasion, erythema, petechiae or rash. Rash is not papular, urticarial or vesicular.  Neurological:     Mental Status: He is alert.  Psychiatric:        Behavior: Behavior is cooperative.      Diagnostic  studies: deferred due to insurance stipulations that require a separate visit for testing          Malachi Bonds, MD [facility]

## 2023-08-29 NOTE — Patient Instructions (Addendum)
 1. Rhinorrhea - Because of insurance stipulations, we cannot do skin testing on the same day as your first visit. - We are all working to fight this, but for now we need to do two separate visits.  - We will know more after we do testing at the next visit.  - The skin testing visit can be squeezed in at your convenience.  - Then we can make a more full plan to address all of your symptoms. - Be sure to stop your antihistamines for 3 days before this appointment.   2. Return in about 1 week (around 09/05/2023) for SKIN TESTING. You can have the follow up appointment with Dr. Dellis Anes or a Nurse Practicioner (our Nurse Practitioners are excellent and always have Physician oversight!).    Please inform us of any Emergency Department visits, hospitalizations, or changes in symptoms. Call us before going to the ED for breathing or allergy symptoms since we might be able to fit you in for a sick visit. Feel free to contact us anytime with any questions, problems, or concerns.  It was a pleasure to meet you today!  Websites that have reliable patient information: 1. American Academy of Asthma, Allergy, and Immunology: www.aaaai.org 2. Food Allergy Research and Education (FARE): foodallergy.org 3. Mothers of Asthmatics: http://www.asthmacommunitynetwork.org 4. American College of Allergy, Asthma, and Immunology: www.acaai.org      "Like" [place] on Facebook and Instagram for our latest updates!      A healthy democracy works best when [employer] participate! Make sure you are registered to vote! If you have moved or changed any of your contact information, you will need to get this updated before voting! Scan the QR codes below to learn more!

## 2023-09-05 ENCOUNTER — Encounter: Payer: Self-pay | Admitting: Allergy & Immunology

## 2023-09-05 ENCOUNTER — Ambulatory Visit (INDEPENDENT_AMBULATORY_CARE_PROVIDER_SITE_OTHER): Admitting: Allergy & Immunology

## 2023-09-05 DIAGNOSIS — J310 Chronic rhinitis: Secondary | ICD-10-CM

## 2023-09-05 MED ORDER — AZELASTINE HCL 0.1 % NA SOLN: 2.0000 | Freq: Two times a day (BID) | NASAL | 12 refills | Status: AC

## 2023-09-05 NOTE — Progress Notes (Signed)
 FOLLOW UP  Date of Service/Encounter:  09/05/23   Assessment:   Non-allergic rhinitis - with negative testing to the environmental allergy panel  Plan/Recommendations:   1. Rhinorrhea - Testing today showed: NEGATIVE TO THE ENTIRE PANEL - Copy of testing results provided. - We did not do intradermal testing (this is more sensitive, but involves needles in the arm). - Let us know if you want to pursue this. - At this point, it seems that there is no allergic trigger that we can find.  - Continue with: with all of the medications that you were doing - Script written for Astelin to see if this would be more affordable as a prescription versus OTC.   2. Return if symptoms worsen or fail to improve.    Subjective:   Nicholas Mosley is a 48 y.o. male presenting today for follow up of No chief complaint on file.   Nicholas Mosley has a history of the following: Patient Active Problem List   Diagnosis Date Noted   Sebaceous cyst 11/26/2019    History obtained from: chart review and patient.  Discussed the use of AI scribe software for clinical note transcription with the patient and/or guardian, who gave verbal consent to proceed.  Nicholas Mosley is a 48 y.o. male presenting for skin testing. He was last seen on March 11. We could not do testing because his insurance company does not cover testing on the same day as a New Patient visit. He has been off of all antihistamines 3 days in anticipation of the testing.   Otherwise, there have been no changes to his past medical history, surgical history, family history, or social history.    Review of systems otherwise negative other than that mentioned in the HPI.    Objective:   There were no vitals taken for this visit. There is no height or weight on file to calculate BMI.    Physical exam deferred since this was a skin testing appointment only.   Diagnostic studies:   Allergy Studies:     Airborne Adult Perc - 09/05/23  0900     Time Antigen Placed 2440    Allergen Manufacturer Waynette Buttery    Location Back    Number of Test 55    Panel 1 Select    1. Control-Buffer 50% Glycerol Negative    2. Control-Histamine 3+    3. Bahia Negative    4. French Southern Territories Negative    5. Johnson Negative    6. Kentucky Blue Negative    7. Meadow Fescue Negative    8. Perennial Rye Negative    9. Damontae Negative    10. Ragweed Mix Negative    11. Cocklebur Negative    12. Plantain,  English Negative    13. Baccharis Negative    14. Dog Fennel Negative    15. Russian Thistle Negative    16. Lamb's Quarters Negative    17. Sheep Sorrell Negative    18. Rough Pigweed Negative    19. Marsh Elder, Rough Negative    20. Mugwort, Common Negative    21. Box, Elder Negative    22. Cedar, red Negative    23. Sweet Gum Negative    24. Pecan Pollen Negative    25. Pine Mix Negative    26. Walnut, Black Pollen Negative    27. Red Mulberry Negative    28. Ash Mix Negative    29. Birch Mix Negative    30. Van Clines American Negative  31. Cottonwood, Eastern Negative    32. Hickory, White Negative    33. Maple Mix Negative    34. Oak, Guinea-Bissau Mix Negative    35. Sycamore Eastern Negative    36. Alternaria Alternata Negative    37. Cladosporium Herbarum Negative    38. Aspergillus Mix Negative    39. Penicillium Mix Negative    40. Bipolaris Sorokiniana (Helminthosporium) Negative    41. Drechslera Spicifera (Curvularia) Negative    42. Mucor Plumbeus Negative    43. Fusarium Moniliforme Negative    44. Aureobasidium Pullulans (pullulara) Negative    45. Rhizopus Oryzae Negative    46. Botrytis Cinera Negative    47. Epicoccum Nigrum Negative    48. Phoma Betae Negative    49. Dust Mite Mix Negative    50. Cat Hair 10,000 BAU/ml Negative    51.  Dog Epithelia Negative    52. Mixed Feathers Negative    53. Horse Epithelia Negative    54. Cockroach, German Negative    55. Tobacco Leaf Negative                Allergy testing results were read and interpreted by myself, documented by clinical staff.      Nicholas Bonds, MD  [facility]

## 2023-09-05 NOTE — Patient Instructions (Addendum)
 1. Rhinorrhea - Testing today showed: NEGATIVE TO THE ENTIRE PANEL - Copy of testing results provided. - We did not do intradermal testing (this is more sensitive, but involves needles in the arm). - Let us know if you want to pursue this. - At this point, it seems that there is no allergic trigger that we can find.  - Continue with: with all of the medications that you were doing  2. Return if symptoms worsen or fail to improve.    Please inform us of any Emergency Department visits, hospitalizations, or changes in symptoms. Call us before going to the ED for breathing or allergy symptoms since we might be able to fit you in for a sick visit. Feel free to contact us anytime with any questions, problems, or concerns.  It was a pleasure to meet you today!  Websites that have reliable patient information: 1. American Academy of Asthma, Allergy, and Immunology: www.aaaai.org 2. Food Allergy Research and Education (FARE): foodallergy.org 3. Mothers of Asthmatics: http://www.asthmacommunitynetwork.org 4. American College of Allergy, Asthma, and Immunology: www.acaai.org      "Like" [place] on Facebook and Instagram for our latest updates!      A healthy democracy works best when [employer] participate! Make sure you are registered to vote! If you have moved or changed any of your contact information, you will need to get this updated before voting! Scan the QR codes below to learn more!          Airborne Adult Perc - 09/05/23 0900     Time Antigen Placed 1610    Allergen Manufacturer Waynette Buttery    Location Back    Number of Test 55    Panel 1 Select    1. Control-Buffer 50% Glycerol Negative    2. Control-Histamine 3+    3. Bahia Negative    4. French Southern Territories Negative    5. Johnson Negative    6. Kentucky Blue Negative    7. Meadow Fescue Negative    8. Perennial Rye Negative    9. Omega Negative    10. Ragweed Mix Negative    11. Cocklebur Negative    12. Plantain,  English  Negative    13. Baccharis Negative    14. Dog Fennel Negative    15. Russian Thistle Negative    16. Lamb's Quarters Negative    17. Sheep Sorrell Negative    18. Rough Pigweed Negative    19. Marsh Elder, Rough Negative    20. Mugwort, Common Negative    21. Box, Elder Negative    22. Cedar, red Negative    23. Sweet Gum Negative    24. Pecan Pollen Negative    25. Pine Mix Negative    26. Walnut, Black Pollen Negative    27. Red Mulberry Negative    28. Ash Mix Negative    29. Birch Mix Negative    30. Beech American Negative    31. Cottonwood, Guinea-Bissau Negative    32. Hickory, White Negative    33. Maple Mix Negative    34. Oak, Guinea-Biss[place] Mix Negative    35. Sycamore Eastern Negative    36. Alternaria Alternata Negative    37. Cladosporium Herbarum Negative    38. Aspergillus Mix Negative    39. Penicillium Mix Negative    40. Bipolaris Sorokiniana (Helminthosporium) Negative    41. Drechslera Spicifera (Curvularia) Negative    42. Mucor Plumbeus Negative    43. Fusarium Moniliforme Negative    44.  Aureobasidium Pullulans (pullulara) Negative    45. Rhizopus Oryzae Negative    46. Botrytis Cinera Negative    47. Epicoccum Nigrum Negative    48. Phoma Betae Negative    49. Dust Mite Mix Negative    50. Cat Hair 10,000 BAU/ml Negative    51.  Dog Epithelia Negative    52. Mixed Feathers Negative    53. Horse Epithelia Negative    54. Cockroach, German Negative    55. Tobacco Leaf Negative
